# Patient Record
Sex: Male | Born: 1965 | Race: White | Hispanic: No | Marital: Married | Smoking: Never smoker
Health system: Southern US, Community
[De-identification: ages and names within clinical notes are randomized; demographics above are authoritative.]

## PROBLEM LIST (undated history)

## (undated) DIAGNOSIS — M94261 Chondromalacia, right knee: Secondary | ICD-10-CM

## (undated) DIAGNOSIS — R079 Chest pain, unspecified: Secondary | ICD-10-CM

## (undated) DIAGNOSIS — D481 Neoplasm of uncertain behavior of connective and other soft tissue: Secondary | ICD-10-CM

## (undated) DIAGNOSIS — D179 Benign lipomatous neoplasm, unspecified: Secondary | ICD-10-CM

## (undated) HISTORY — PX: VASECTOMY: SHX75

## (undated) HISTORY — PX: KNEE SURGERY: SHX244

## (undated) HISTORY — DX: Neoplasm of uncertain behavior of connective and other soft tissue: D48.1

## (undated) HISTORY — DX: Chondromalacia, right knee: M94.261

## (undated) HISTORY — DX: Chest pain, unspecified: R07.9

## (undated) HISTORY — PX: SHOULDER ARTHROSCOPY: SHX128

## (undated) HISTORY — DX: Benign lipomatous neoplasm, unspecified: D17.9

## (undated) HISTORY — PX: CHOLECYSTECTOMY: SHX55

---

## 2008-06-28 ENCOUNTER — Encounter: Admission: RE | Admit: 2008-06-28 | Discharge: 2008-06-28 | Payer: Self-pay | Admitting: Neurosurgery

## 2011-07-30 ENCOUNTER — Emergency Department (HOSPITAL_COMMUNITY): Payer: BC Managed Care – PPO

## 2011-07-30 ENCOUNTER — Emergency Department (HOSPITAL_COMMUNITY)
Admission: EM | Admit: 2011-07-30 | Discharge: 2011-07-31 | Disposition: A | Discharge status: Home / Self Care | Payer: BC Managed Care – PPO | Attending: Emergency Medicine | Admitting: Emergency Medicine

## 2011-07-30 ENCOUNTER — Encounter (HOSPITAL_COMMUNITY): Payer: Self-pay | Admitting: Emergency Medicine

## 2011-07-30 DIAGNOSIS — Z9852 Vasectomy status: Secondary | ICD-10-CM | POA: Insufficient documentation

## 2011-07-30 DIAGNOSIS — T148XXA Other injury of unspecified body region, initial encounter: Secondary | ICD-10-CM

## 2011-07-30 DIAGNOSIS — I861 Scrotal varices: Secondary | ICD-10-CM | POA: Insufficient documentation

## 2011-07-30 DIAGNOSIS — Z9889 Other specified postprocedural states: Secondary | ICD-10-CM | POA: Insufficient documentation

## 2011-07-30 DIAGNOSIS — R1031 Right lower quadrant pain: Secondary | ICD-10-CM | POA: Insufficient documentation

## 2011-07-30 DIAGNOSIS — R10813 Right lower quadrant abdominal tenderness: Secondary | ICD-10-CM | POA: Insufficient documentation

## 2011-07-30 DIAGNOSIS — X58XXXA Exposure to other specified factors, initial encounter: Secondary | ICD-10-CM | POA: Insufficient documentation

## 2011-07-30 LAB — CBC
HCT: 45.7 % (ref 39.0–52.0)
Hemoglobin: 16.2 g/dL (ref 13.0–17.0)
MCHC: 35.4 g/dL (ref 30.0–36.0)
RDW: 11.9 % (ref 11.5–15.5)
WBC: 6.8 10*3/uL (ref 4.0–10.5)

## 2011-07-30 LAB — URINALYSIS, ROUTINE W REFLEX MICROSCOPIC
Glucose, UA: 100 mg/dL — AB
Ketones, ur: NEGATIVE mg/dL
Leukocytes, UA: NEGATIVE
Nitrite: NEGATIVE
pH: 5.5 (ref 5.0–8.0)

## 2011-07-30 LAB — DIFFERENTIAL
Basophils Absolute: 0 10*3/uL (ref 0.0–0.1)
Basophils Relative: 0 % (ref 0–1)
Lymphocytes Relative: 37 % (ref 12–46)
Monocytes Relative: 5 % (ref 3–12)
Neutro Abs: 3.9 10*3/uL (ref 1.7–7.7)
Neutrophils Relative %: 57 % (ref 43–77)

## 2011-07-30 LAB — BASIC METABOLIC PANEL
CO2: 27 mEq/L (ref 19–32)
Chloride: 104 mEq/L (ref 96–112)
GFR calc Af Amer: >90 mL/min (ref >90)
Potassium: 3.9 mEq/L (ref 3.5–5.1)

## 2011-07-30 MED ORDER — IOHEXOL 300 MG/ML  SOLN
100.0000 mL | Freq: Once | INTRAMUSCULAR | Status: AC | PRN
  Administered 2011-07-30: 100 mL via INTRAVENOUS

## 2011-07-30 NOTE — ED Notes (Signed)
Injured while at work 06/27/2011. Has been managed by Worker's comp at  [employer]. Referred pt to a surgeon in [place] and was recommended to have a CT but was denied by worker's comp

## 2011-07-30 NOTE — ED Notes (Signed)
Pt c/o RLQ abd pain down into right testical. Also has rectal pain.

## 2011-07-30 NOTE — ED Provider Notes (Signed)
History     CSN: [id number]  Arrival date & time 07/30/11  1639   First MD Initiated Contact with Patient 07/30/11 2035      Chief Complaint  Patient presents with  . Abdominal Pain    HPI  History provided by the patient and family. Patient is a 46 year old male with history of cystectomy, facetectomy who presents with complaints of persistent right lower abdominal pains for the past month. Patient states that pain initially came after injuring himself at work lifting heavy boxes. Pain is sharp and persistent and radiates down the groin towards the testicle area. Patient denies having any fever, chills, sweats, decreased appetite, nausea or vomiting. Patient works at [employer] and was sent to [employer]. She has had multiple exams during that time. Patient also developed large hemorrhoids following the injury which he had excised. She does report having sensation of return of a hemorrhoid. Patient was also given a referral to general surgeon who did an exam and told patient he was slightly concerned for possible hernia it was difficult to identify on exam. He has recommended the patient undergo a CAT scan. Patient states that [employer]. not approve for him to have a scan and so he was concerned about returning to work without having a scan performed or knowing what was causing his pains.    No past medical history on file.  Past Surgical History  Procedure Date  . Cholecystectomy   . Vasectomy   . Shoulder arthroscopy     No family history on file.  History  Substance Use Topics  . Smoking status: Not on file  . Smokeless tobacco: Not on file  . Alcohol Use:       Review of Systems  Constitutional: Negative for fever and chills.  Respiratory: Negative for shortness of breath.   Cardiovascular: Negative for chest pain.  Gastrointestinal: Positive for abdominal pain. Negative for nausea, vomiting, diarrhea, constipation and blood in stool.  Genitourinary:  Negative for dysuria, frequency, hematuria and flank pain.    Allergies  Morphine and related  Home Medications   Current Outpatient Rx  Name Route Sig Dispense Refill  . TADALAFIL 10 MG PO TABS Oral Take 10 mg by mouth daily as needed.      BP 126/80  Pulse 62  Temp(Src) 98.3 F (36.8 C) (Oral)  Resp 16  SpO2 99%  Physical Exam  Nursing note and vitals reviewed. Constitutional: He is oriented to person, place, and time. He appears well-developed and well-nourished. No distress.  HENT:  Head: Normocephalic and atraumatic.  Cardiovascular: Normal rate and regular rhythm.   Pulmonary/Chest: Effort normal and breath sounds normal.  Abdominal: Soft. He exhibits no distension. There is tenderness in the right lower quadrant. There is no rebound, no guarding, no CVA tenderness, no tenderness at McBurney's point and negative Murphy's sign.  Genitourinary: Penis normal.       Small varicoceles bilaterally. Testicles normal nontender. No swelling of scrotum or testicles.  No appreciable inguinal hernia identified.  Neurological: He is alert and oriented to person, place, and time.  Skin: Skin is warm.  Psychiatric: He has a normal mood and affect. His behavior is normal.    ED Course  Procedures   Results for orders placed during the hospital encounter of 07/30/11  URINALYSIS, ROUTINE W REFLEX MICROSCOPIC      Component Value Range   Color, Urine YELLOW  YELLOW    APPearance CLOUDY (*) CLEAR    Specific Gravity,  Urine 1.028  1.005 - 1.030    pH 5.5  5.0 - 8.0    Glucose, UA 100 (*) NEGATIVE (mg/dL)   Hgb urine dipstick NEGATIVE  NEGATIVE    Bilirubin Urine NEGATIVE  NEGATIVE    Ketones, ur NEGATIVE  NEGATIVE (mg/dL)   Protein, ur NEGATIVE  NEGATIVE (mg/dL)   Urobilinogen, UA 0.2  0.0 - 1.0 (mg/dL)   Nitrite NEGATIVE  NEGATIVE    Leukocytes, UA NEGATIVE  NEGATIVE   CBC      Component Value Range   WBC 6.8  4.0 - 10.5 (K/uL)   RBC 5.08  4.22 - 5.81 (MIL/uL)   Hemoglobin  16.2  13.0 - 17.0 (g/dL)   HCT 29.5  62.1 - 30.8 (%)   MCV 90.0  78.0 - 100.0 (fL)   MCH 31.9  26.0 - 34.0 (pg)   MCHC 35.4  30.0 - 36.0 (g/dL)   RDW 65.7  84.6 - 96.2 (%)   Platelets 234  150 - 400 (K/uL)  DIFFERENTIAL      Component Value Range   Neutrophils Relative 57  43 - 77 (%)   Neutro Abs 3.9  1.7 - 7.7 (K/uL)   Lymphocytes Relative 37  12 - 46 (%)   Lymphs Abs 2.5  0.7 - 4.0 (K/uL)   Monocytes Relative 5  3 - 12 (%)   Monocytes Absolute 0.3  0.1 - 1.0 (K/uL)   Eosinophils Relative 1  0 - 5 (%)   Eosinophils Absolute 0.1  0.0 - 0.7 (K/uL)   Basophils Relative 0  0 - 1 (%)   Basophils Absolute 0.0  0.0 - 0.1 (K/uL)  BASIC METABOLIC PANEL      Component Value Range   Sodium 139  135 - 145 (mEq/L)   Potassium 3.9  3.5 - 5.1 (mEq/L)   Chloride 104  96 - 112 (mEq/L)   CO2 27  19 - 32 (mEq/L)   Glucose, Bld 93  70 - 99 (mg/dL)   BUN 10  6 - 23 (mg/dL)   Creatinine, Ser 9.52  0.50 - 1.35 (mg/dL)   Calcium 9.5  8.4 - 84.1 (mg/dL)   GFR calc non Af Amer 85 (*) >90 (mL/min)   GFR calc Af Amer >90  >90 (mL/min)      Ct Abdomen Pelvis W Contrast  07/31/2011  *RADIOLOGY REPORT*  Clinical Data: Right lower quadrant pain radiating to the rectum and right testicle.  CT ABDOMEN AND PELVIS WITH CONTRAST  Technique:  Multidetector CT imaging of the abdomen and pelvis was performed following the standard protocol during bolus administration of intravenous contrast.  Contrast: OMNIPAQUE IOHEXOL 300 MG/ML  SOLN  Comparison: None.  Findings: Mild dependent changes in the lung bases.  Small subpleural nodules likely representing lymph nodes.  Surgical absence of the gallbladder.  The liver, spleen, pancreas, adrenal glands, kidneys, abdominal aorta, and retroperitoneal lymph nodes are unremarkable.  Small accessory spleen.  The stomach and small bowel are decompressed.  Colon is decompressed.  No free air or free fluid in the abdomen.  Prominent visceral adipose tissues.  Pelvis:  The  prostate gland is not enlarged.  The bladder is decompressed.  The the colon wall appears to be mildly thickened but is diffusely decompressed.  Apparent wall thickness in the colon may be due to under distension but colitis is not excluded and correlation with clinical and laboratory assessment is recommended.  The appendix is normal.  No free or loculated pelvic fluid  collections.  No significant pelvic lymphadenopathy.  Normal alignment of the lumbar vertebrae with mild degenerative changes.  IMPRESSION: Nonspecific mild appearing colonic thickening which might just be due to under distension but colitis is not excluded.  Appendix is normal.  No renal hydronephrosis or ureterectasis.  Original Report Authenticated By: Marlon Pel, M.D.     1. Abdominal pain   2. Muscle strain       MDM  Patient seen and evaluated. Patient in no acute distress.  Patient with unremarkable CT scan and labwork. There is not appear to be any hernias. Appendix is normal. At this time patient is felt able to return home and followup with employee health and PCP.      Angus Seller, [place] 08/01/11 [id number]

## 2011-07-31 NOTE — Discharge Instructions (Signed)
You were seen and evaluated today for your symptoms of abdominal pain. Your CAT scan did not show any signs for hernia or appendicitis infection. Please continue followup with your primary care providers and employee health specialist for continued evaluation and treatment of your symptoms. Return to emergency room for any worsening pain, fever, chills, sweats, persistent nausea vomiting.   Abdominal Pain Abdominal pain can be caused by many things. Your caregiver decides the seriousness of your pain by an examination and possibly blood tests and X-rays. Many cases can be observed and treated at home. Most abdominal pain is not caused by a disease and will probably improve without treatment. However, in many cases, more time must pass before a clear cause of the pain can be found. Before that point, it may not be known if you need more testing, or if hospitalization or surgery is needed. HOME CARE INSTRUCTIONS   Do not take laxatives unless directed by your caregiver.   Take pain medicine only as directed by your caregiver.   Only take over-the-counter or prescription medicines for pain, discomfort, or fever as directed by your caregiver.   Try a clear liquid diet (broth, tea, or water) for as long as directed by your caregiver. Slowly move to a bland diet as tolerated.  SEEK IMMEDIATE MEDICAL CARE IF:   The pain does not go away.   You have a fever.   You keep throwing up (vomiting).   The pain is felt only in portions of the abdomen. Pain in the right side could possibly be appendicitis. In an adult, pain in the left lower portion of the abdomen could be colitis or diverticulitis.   You pass bloody or black tarry stools.  MAKE SURE YOU:   Understand these instructions.   Will watch your condition.   Will get help right away if you are not doing well or get worse.  Document Released: 11/29/2004 Document Revised: 02/08/2011 Document Reviewed: 10/08/2007 [facility] Patient Information  2012 [place], [place].   RESOURCE GUIDE  Dental Problems  Patients with Medicaid: [facility] [phone] [address]                                           [phone] Phone:  [phone]                                                  Phone:  [phone]  If unable to pay or uninsured, contact:  [facility] or [facility]. to become qualified for the adult dental clinic.  Chronic Pain Problems Contact [facility]  [phone] Patients need to be referred by their primary care doctor.  Insufficient Money for Medicine Contact United Way:  call "211" or [facility] [phone].  No Primary Care Doctor Call [facility]  [phone] Other agencies that provide inexpensive medical care    [facility]  [phone]    [facility]  [phone]    [facility]  [phone]    [facility]  [phone]    Planned Parenthood  [phone]    [facility]  [phone]  Psychological Services [facility]  [phone] [facility]  [phone] [facility]   [phone] (emergency services [phone])  Substance Abuse Resources [facility]  ([phone] [facility] [phone]) [phone] [facility] [phone] Floydene Flock [phone] Residential & Outpatient Substance Abuse Program  ([phone]  Abuse/Neglect [facility] Child Abuse Hotline 873-453-2659 [facility] Child Abuse Hotline 503-439-1625 (After Hours)  Emergency [facility] Minist[facility] 351-141-2313  [facility] (570)266-8820 [facility] (916)590-6257  MRSA Hotline #:   (201)350-0093    [facility] Resources  [facility]     United [employer]                          [facility]. [address] [place]                        [address]      [address]  Blondell Reveal Phone:  [phone]                                   Phone:  [phone]                 Phone:  [phone]  [facility] Phone:  [phone]  [facility] Child Abuse Hotline (810)493-0831 (865)590-9727 (After Hours)

## 2011-08-01 NOTE — ED Provider Notes (Signed)
Medical screening examination/treatment/procedure(s) were performed by non-physician practitioner and as supervising physician I was immediately available for consultation/collaboration.  Juliet Rude. Rubin Payor, MD 08/01/11 [phone]

## 2012-01-03 DIAGNOSIS — D4819 Other specified neoplasm of uncertain behavior of connective and other soft tissue: Secondary | ICD-10-CM

## 2012-01-03 DIAGNOSIS — D481 Neoplasm of uncertain behavior of connective and other soft tissue: Secondary | ICD-10-CM

## 2012-01-03 HISTORY — DX: Other specified neoplasm of uncertain behavior of connective and other soft tissue: D48.19

## 2012-01-03 HISTORY — DX: Neoplasm of uncertain behavior of connective and other soft tissue: D48.1

## 2012-01-15 DIAGNOSIS — D179 Benign lipomatous neoplasm, unspecified: Secondary | ICD-10-CM | POA: Insufficient documentation

## 2012-01-15 DIAGNOSIS — M94261 Chondromalacia, right knee: Secondary | ICD-10-CM

## 2012-01-15 HISTORY — DX: Chondromalacia, right knee: M94.261

## 2012-01-15 HISTORY — DX: Benign lipomatous neoplasm, unspecified: D17.9

## 2015-07-20 ENCOUNTER — Encounter: Payer: Self-pay | Admitting: Internal Medicine

## 2015-08-31 ENCOUNTER — Ambulatory Visit: Payer: Self-pay | Admitting: Internal Medicine

## 2015-09-01 ENCOUNTER — Telehealth: Payer: Self-pay | Admitting: *Deleted

## 2015-09-01 NOTE — Telephone Encounter (Signed)
No show letter mailed to patient. 

## 2016-11-23 ENCOUNTER — Ambulatory Visit (INDEPENDENT_AMBULATORY_CARE_PROVIDER_SITE_OTHER): Payer: BLUE CROSS/BLUE SHIELD | Admitting: Podiatry

## 2016-11-23 ENCOUNTER — Encounter: Payer: Self-pay | Admitting: Podiatry

## 2016-11-23 ENCOUNTER — Ambulatory Visit (INDEPENDENT_AMBULATORY_CARE_PROVIDER_SITE_OTHER): Payer: BLUE CROSS/BLUE SHIELD

## 2016-11-23 ENCOUNTER — Other Ambulatory Visit: Payer: Self-pay | Admitting: Podiatry

## 2016-11-23 VITALS — BP 125/76 | HR 77 | Resp 16

## 2016-11-23 DIAGNOSIS — M722 Plantar fascial fibromatosis: Secondary | ICD-10-CM

## 2016-11-23 NOTE — Patient Instructions (Signed)

## 2016-11-23 NOTE — Progress Notes (Signed)
  Subjective:  Patient ID: Kevin Nielsen, male    DOB: May 24, 1965,  MRN: [id number] HPI Chief Complaint  Patient presents with  . Foot Pain    Plantar heel right - aching for 3 years, gotten worse recently, AM pain, tried ice-helped initially    51 y.o. male presents with the above complaint. Reports right heel pain 3 years duration. Worst in the a.m.. Has tried ice without relief. Denies other pedal issues No past medical history on file. Past Surgical History:  Procedure Laterality Date  . CHOLECYSTECTOMY    . SHOULDER ARTHROSCOPY    . VASECTOMY     No current outpatient prescriptions on file.  Allergies  Allergen Reactions  . Morphine And Related Hives   Review of Systems  Musculoskeletal: Positive for gait problem.  All other systems reviewed and are negative.  Objective:   Vitals:   11/23/16 1118  BP: 125/76  Pulse: 77  Resp: 16   General AA&O x3. Normal mood and affect.  Vascular Dorsalis pedis and posterior tibial pulses  present 2+ bilaterally  Capillary refill normal to all digits. Pedal hair growth normal.  Neurologic Epicritic sensation grossly present bilaterally.  Dermatologic No open lesions. Interspaces clear of maceration. Nails well groomed and normal in appearance.  Orthopedic: MMT 5/5 in dorsiflexion, plantarflexion, inversion, and eversion bilaterally. Tender to palpation at the calcaneal tuber right. No pain with calcaneal squeeze right. Ankle ROM diminished range of motion right. Silfverskiold Test: positive right.   Radiographs: Taken and reviewed. No acute fractures. No evidence of calcaneal stress fracture.  Assessment & Plan:  Patient was evaluated and treated and all questions answered.  Plantar Fasciitis, left - XR reviewed as above.  - Educated on icing and stretching. Instructions given.   - Injection consisting of 1cc 0.5 % Marcaine plain, delivered to the plantar fascia.  Procedure: Injection Tendon/Ligament Location: Left  plantar fascia at the glabrous junction; medial approach. Skin Prep: Alcohol. Injectate: 1 cc 0.5% marcaine plain, 1 cc dexamethasone phosphate, 0.5 cc kenalog 10. Disposition: Patient tolerated procedure well. Injection site dressed with a band-aid.  Return in about 4 weeks (around 12/21/2016).

## 2016-12-04 ENCOUNTER — Telehealth: Payer: Self-pay | Admitting: Podiatry

## 2016-12-04 MED ORDER — MELOXICAM 15 MG PO TABS: 15.0000 mg | ORAL_TABLET | Freq: Every day | ORAL | 0 refills | Status: DC

## 2016-12-04 NOTE — Telephone Encounter (Signed)
I was in and saw Dr. March Rummage about two weeks ago and they were supposed to call in some prescriptions which they never did. Also, I need you to fax my medical records over to Dr. Jackelyn Knife office at [phone]. Thank you.

## 2016-12-04 NOTE — Telephone Encounter (Signed)
Left message informing pt the medication had been sent to the [facility], and apologized for the delay.

## 2016-12-04 NOTE — Addendum Note (Signed)
Addended by: Harriett Sine D on: 12/04/2016 03:32 PM   Modules accepted: Orders

## 2016-12-04 NOTE — Telephone Encounter (Signed)
Can we send over Meloxicam 15mg  #30? Thanks

## 2016-12-04 NOTE — Telephone Encounter (Signed)
Called pt and left a voicemail in regards to his medical records being sent to Dr. Davina Poke. Asked pt to call me back to let me know if he himself is requesting his records be sent to Dr. Davina Poke because if so he would need to sign a release form or if Dr. Davina Poke is his PCP and is requesting his records from his new pt appointment with [place]. Asked pt to call me back directly at [phone] so I can take care of this for him.

## 2016-12-07 ENCOUNTER — Ambulatory Visit: Payer: BLUE CROSS/BLUE SHIELD | Admitting: Podiatry

## 2016-12-21 ENCOUNTER — Ambulatory Visit (INDEPENDENT_AMBULATORY_CARE_PROVIDER_SITE_OTHER): Payer: BLUE CROSS/BLUE SHIELD | Admitting: Podiatry

## 2016-12-21 ENCOUNTER — Ambulatory Visit (INDEPENDENT_AMBULATORY_CARE_PROVIDER_SITE_OTHER): Payer: BLUE CROSS/BLUE SHIELD

## 2016-12-21 ENCOUNTER — Encounter: Payer: Self-pay | Admitting: Podiatry

## 2016-12-21 DIAGNOSIS — M722 Plantar fascial fibromatosis: Secondary | ICD-10-CM

## 2016-12-21 DIAGNOSIS — M7661 Achilles tendinitis, right leg: Secondary | ICD-10-CM

## 2016-12-21 NOTE — Progress Notes (Signed)
  Subjective:  Patient ID: Kevin Nielsen, male    DOB: 1965-10-14,  MRN: [id number]  Chief Complaint  Patient presents with  . Plantar Fasciitis    Follow up right heel   "Its been worse actually"   51 y.o. male returns for the above complaint. He says his heel pain is worse than previous visit. States that he was feeling better when he left after his last visit but 2 days later the pain returned. Would like to discuss surgical solution given that his pain has been present for approximately 3 years on and off.  Objective:  There were no vitals filed for this visit. General AA&O x3. Normal mood and affect.  Vascular Dorsalis pedis and posterior tibial pulses  present 2+ bilaterally  Capillary refill normal to all digits. Pedal hair growth normal.  Neurologic Epicritic sensation grossly present bilaterally.  Dermatologic No open lesions. Interspaces clear of maceration. Nails well groomed and normal in appearance.  Orthopedic: MMT 5/5 in dorsiflexion, plantarflexion, inversion, and eversion bilaterally. Tender to palpation at the calcaneal tuber right. POP posterior calcaneus. No pain with calcaneal squeeze right. Ankle ROM diminished range of motion right. Silfverskiold Test: positive right.   Radiographs: Taken and reviewed. No acute fractures. No evidence of calcaneal stress fracture. Posterior and plantar spurring noted.  Assessment & Plan:  Patient was evaluated and treated and all questions answered.  Plantar Fasciitis, Achilles Tendonitis -Patient has failed all conservative therapy including injections shoe gear modification stretching and wishes to discuss surgical options. -Patient has recalcitrant point fasciitis symptoms present for approximately 3 years on and off. -Risk and benefits of plantar fasciotomy and gastric image position discussed with the patient. Patient wishes to proceed. We'll consider open versus endoscopic approach for both procedures. Patient will discuss  a date with the surgical scheduler -Discussed postoperative course with patient including nonweightbearing for 2 weeks. -Will proceed with right gastroc insertion and right plantar fasciotomy - open versus endoscopic for both  15 minutes of face to face time were spent with the patient. >50% of this was spent on counseling and coordination of care. Specifically discussed with patient the surgical plan, post-operative course. Patient wishes to proceed.

## 2016-12-21 NOTE — Patient Instructions (Signed)
Pre-Operative Instructions  Congratulations, you have decided to take an important step towards improving your quality of life.  You can be assured that the doctors and staff at [facility] will be with you every step of the way.  Here are some important things you should know:  1. Plan to be at the surgery center/hospital at least 1 (one) hour prior to your scheduled time, unless otherwise directed by the surgical center/hospital staff.  You must have a responsible adult accompany you, remain during the surgery and drive you home.  Make sure you have directions to the surgical center/hospital to ensure you arrive on time. 2. If you are having surgery at [facility] or [facility] hospitals, you will need a copy of your medical history and physical form from your family physician within one month prior to the date of surgery. We will give you a form for your primary physician to complete.  3. We make every effort to accommodate the date you request for surgery.  However, there are times where surgery dates or times have to be moved.  We will contact you as soon as possible if a change in schedule is required.   4. No aspirin/ibuprofen for one week before surgery.  If you are on aspirin, any non-steroidal anti-inflammatory medications (Mobic, Aleve, Ibuprofen) should not be taken seven (7) days prior to your surgery.  You make take Tylenol for pain prior to surgery.  5. Medications - If you are taking daily heart and blood pressure medications, seizure, reflux, allergy, asthma, anxiety, pain or diabetes medications, make sure you notify the surgery center/hospital before the day of surgery so they can tell you which medications you should take or avoid the day of surgery. 6. No food or drink after midnight the night before surgery unless directed otherwise by surgical center/hospital staff. 7. No alcoholic beverages 24-hours prior to surgery.  No smoking 24-hours prior or 24-hours after  surgery. 8. Wear loose pants or shorts. They should be loose enough to fit over bandages, boots, and casts. 9. Don't wear slip-on shoes. Sneakers are preferred. 10. Bring your boot with you to the surgery center/hospital.  Also bring crutches or a walker if your physician has prescribed it for you.  If you do not have this equipment, it will be provided for you after surgery. 11. If you have not been contacted by the surgery center/hospital by the day before your surgery, call to confirm the date and time of your surgery. 12. Leave-time from work may vary depending on the type of surgery you have.  Appropriate arrangements should be made prior to surgery with your employer. 13. Prescriptions will be provided immediately following surgery by your doctor.  Fill these as soon as possible after surgery and take the medication as directed. Pain medications will not be refilled on weekends and must be approved by the doctor. 14. Remove nail polish on the operative foot and avoid getting pedicures prior to surgery. 15. Wash the night before surgery.  The night before surgery wash the foot and leg well with water and the antibacterial soap provided. Be sure to pay special attention to beneath the toenails and in between the toes.  Wash for at least three (3) minutes. Rinse thoroughly with water and dry well with a towel.  Perform this wash unless told not to do so by your physician.  Enclosed: 1 Ice pack (please put in freezer the night before surgery)   1 Hibiclens skin cleaner     Pre-op instructions  If you have any questions regarding the instructions, please do not hesitate to call our office.  [place]: [address], [place], [place] [address] -- [phone]  [place]: [address] [place], [place] [address] -- [phone]  [place]: [address]  [place], [place] [address] -- [phone]  [place]: [address], [place], [place] [address] -- [phone]  Website:  [email or web address] 

## 2016-12-27 ENCOUNTER — Telehealth: Payer: Self-pay | Admitting: *Deleted

## 2016-12-27 NOTE — Telephone Encounter (Signed)
I am calling to see if you have a new insurance card.  The cards that we have, [employer] is saying they are inactive.  "I have a new card but it doesn't go into effect until next year."  Can you give me the id for that card?  "It is [id number] and the group number is [id number]."  I'll try this number.

## 2016-12-31 ENCOUNTER — Telehealth: Payer: Self-pay | Admitting: *Deleted

## 2016-12-31 NOTE — Telephone Encounter (Signed)
"  I'm having surgery on Wednesday.  Can you let me know what time and where?"

## 2017-01-01 NOTE — Telephone Encounter (Signed)
Noted. Thanks.

## 2017-01-01 NOTE — Telephone Encounter (Addendum)
"  I am scheduled for surgery on tomorrow.  I received a call from someone at the surgery center that said I would have to pay them $2800 day of surgery.  Can you tell me why that is?"  Your insurance just renewed.  You have a $2500 deductible that has not been met.  Once the deductible is met, you will be responsible for 20%.  "Okay, I don't have that type of money right now.  I'm going to have to reschedule to about a month out."  He can do it on November 28.  "That date will be fine.  They told me I could pay $900 and then make payments but just go ahead and reschedule it to 01/30/2017."  I called Caren Griffins at [facility] and rescheduled the surgery from 01/02/2017 to 01/30/2017.

## 2017-01-09 ENCOUNTER — Encounter: Payer: BLUE CROSS/BLUE SHIELD | Admitting: Podiatry

## 2017-01-16 ENCOUNTER — Encounter: Payer: Self-pay | Admitting: Podiatry

## 2017-01-29 ENCOUNTER — Telehealth: Payer: Self-pay | Admitting: *Deleted

## 2017-01-29 NOTE — Telephone Encounter (Signed)
"  I'm scheduled for surgery tomorrow.  I know this is last minute but I want to reschedule my surgery to late January.  My insurance is changing.  I want to pay off some of this deductible a little before then."  He can do it January 30.  "That will be fine."  I left Leontine Locket, a message to reschedule Rossie's surgery from November 28 to January 30.

## 2017-02-06 ENCOUNTER — Encounter: Payer: BLUE CROSS/BLUE SHIELD | Admitting: Podiatry

## 2017-02-07 ENCOUNTER — Encounter: Payer: BLUE CROSS/BLUE SHIELD | Admitting: Podiatry

## 2017-02-13 ENCOUNTER — Encounter: Payer: Self-pay | Admitting: Podiatry

## 2017-02-14 ENCOUNTER — Encounter: Payer: Self-pay | Admitting: Podiatry

## 2017-03-22 ENCOUNTER — Telehealth: Payer: Self-pay | Admitting: *Deleted

## 2017-03-22 NOTE — Telephone Encounter (Signed)
You're scheduled for surgery on 04/03/2017.  Are you still having the surgery?  "Yes, I am.  Do you have a new insurance policy.  "Yes I do.  I don't have the cards yet.  They will be in the mail next week.  I know the numbers do you want me to give you those?"  Yes, that will be great.  "The ID number is [id number] and the group number is [phone].  I have their phone number do you want it?"  Yes, I do and what's the name of the company is it still [place].  "I am not sure the phone number is [phone].  I think it's [employer]."  Okay, I'll give them a call.

## 2017-03-25 NOTE — Telephone Encounter (Signed)
"  This is [place] giving you a call back from [employer] in regards to [employer].  This was in regards to codes [phone] and [id number], those are both based on standard of care.  No prior authorization is required.  Nothing experimental, investigational or cosmetic would be covered.

## 2017-03-27 DIAGNOSIS — R079 Chest pain, unspecified: Secondary | ICD-10-CM

## 2017-03-27 HISTORY — DX: Chest pain, unspecified: R07.9

## 2017-03-28 ENCOUNTER — Telehealth: Payer: Self-pay | Admitting: *Deleted

## 2017-03-28 NOTE — Telephone Encounter (Signed)
Per Levada Dy in the call center, Kevin Nielsen called and stated he wanted to cancel his surgery for January 30 and wants to put it off until the end of February.  He asked me to call him on tomorrow to reschedule the appointment.  I attempted to call patient.  I was not able to leave a message on either phone's voicemail.     I called [facility] and left a message for Caren Griffins asking her to cancel the patient's surgery for January 30.

## 2017-04-09 ENCOUNTER — Encounter: Payer: Self-pay | Admitting: *Deleted

## 2017-04-10 ENCOUNTER — Encounter: Payer: Self-pay | Admitting: Cardiology

## 2017-04-10 ENCOUNTER — Encounter: Payer: Self-pay | Admitting: Podiatry

## 2017-04-11 ENCOUNTER — Encounter: Payer: Self-pay | Admitting: Cardiology

## 2017-04-11 ENCOUNTER — Ambulatory Visit: Payer: PRIVATE HEALTH INSURANCE | Admitting: Cardiology

## 2017-04-11 DIAGNOSIS — R079 Chest pain, unspecified: Secondary | ICD-10-CM

## 2017-04-11 DIAGNOSIS — R0789 Other chest pain: Secondary | ICD-10-CM | POA: Diagnosis not present

## 2017-04-11 DIAGNOSIS — R0609 Other forms of dyspnea: Secondary | ICD-10-CM | POA: Diagnosis not present

## 2017-04-11 DIAGNOSIS — E785 Hyperlipidemia, unspecified: Secondary | ICD-10-CM

## 2017-04-11 MED ORDER — METOPROLOL TARTRATE 25 MG PO TABS: 25.0000 mg | ORAL_TABLET | Freq: Two times a day (BID) | ORAL | 3 refills | Status: AC

## 2017-04-11 MED ORDER — NITROGLYCERIN 0.4 MG SL SUBL: 0.4000 mg | SUBLINGUAL_TABLET | SUBLINGUAL | 5 refills | Status: AC | PRN

## 2017-04-11 NOTE — Progress Notes (Signed)
Cardiology Consultation:    Date:  04/11/2017   ID:  Kevin Nielsen, DOB 22-May-1965, MRN [id number]  PCP:  Enid Skeens., MD  Cardiologist:  Jenne Campus, MD   Referring MD: Enid Skeens., MD   Chief Complaint  Patient presents with  . Chest Pain  I am having chest pain  History of Present Illness:    Kevin Nielsen is a 52 y.o. male who is being seen today for the evaluation of pain at the request of [place], Marshall Cork., MD.  He has risk factors for coronary artery disease.  He does have family history of premature coronary artery disease: Potentially borderline hypertension.  Also history of dyslipidemia.  For last 2-3 months he started experiencing chest pain.  He described pain as heavy sensation like an elephant sitting on his chest.  It can happen at different situations that can happen with exercise can happen at rest.  That sensation can be continues lasting for hours.  He usually does have some shortness of breath associated with this sensation.  He points into the right side of his chest when asked him to show me where the pain typically happens.  This area is slightly tender on palpation's.  At least now he denies having any pleuritic component but there is some documentation in the chart that it was getting worse while taking deep breath.  At the same time he noticed some shortness of breath.  He is getting short of breath when he is doing some work.  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  Still continue to work however does have some difficulty doing it.  A few days ago he ended up going to the emergency room.  EKG was fine, his biochemical markers were normal as well.  He does not exercise on the regular basis.  Past Medical History:  Diagnosis Date  . Chest pain 03/27/2017  . 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 CHOLECYSTECTOMY    . KNEE SURGERY Right    Cleaned out  . SHOULDER ARTHROSCOPY    . VASECTOMY      Current Medications: No outpatient medications have been marked as taking for the 04/11/17 encounter (Office Visit) with Park Liter, MD.     Allergies:   Morphine and related   Social History   Socioeconomic History  . Marital status: Married    Spouse name: None  . Number of children: None  . Years of education: None  . Highest education level: None  Social Needs  . Financial resource strain: None  . Food insecurity - worry: None  . Food insecurity - inability: None  . Transportation needs - medical: None  . Transportation needs - non-medical: None  Occupational History  . None  Tobacco Use  . Smoking status: Never Smoker  . Smokeless tobacco: Never Used  Substance and Sexual Activity  . Alcohol use: Yes    Comment: very little  . Drug use: None  . Sexual activity: None  Other Topics Concern  . None  Social History Narrative  . None     Family History: The patient's family history includes Arthritis in his mother; Cancer in his maternal grandfather; Diabetes in his maternal grandmother; Heart disease in his father. ROS:   Please see the history of present illness.  All 14 point review of systems negative except as described per history of present illness.  EKGs/Labs/Other Studies Reviewed:    The following studies were reviewed today: All laboratory tests and EKG review from emergency room  EKG:  EKG is  ordered today.  The ekg ordered today demonstrates normal sinus rhythm, normal P interval, normal QRS complex duration morphology  Recent Labs: No results found for requested labs within last 8760 hours.  Recent Lipid Panel No results found for: CHOL, TRIG, HDL, CHOLHDL, VLDL, LDLCALC, LDLDIRECT  Physical Exam:    VS:  BP (!) 130/100 (BP Location: Left Arm, Patient Position: Sitting, Cuff Size: Large)   Pulse 85   Ht 6' (1.829 m)   Wt  292 lb (132.5 kg)   SpO2 96%   BMI 39.60 kg/m     Wt Readings from Last 3 Encounters:  04/11/17 292 lb (132.5 kg)  04/08/17 293 lb (132.9 kg)     GEN:  Well nourished, well developed in no acute distress HEENT: Normal NECK: No JVD; No carotid bruits LYMPHATICS: No lymphadenopathy CARDIAC: RRR, no murmurs, no rubs, no gallops RESPIRATORY:  Clear to auscultation without rales, wheezing or rhonchi  ABDOMEN: Soft, non-tender, non-distended MUSCULOSKELETAL:  No edema; No deformity  SKIN: Warm and dry NEUROLOGIC:  Alert and oriented x 3 PSYCHIATRIC:  Normal affect   ASSESSMENT:    1. Atypical chest pain   2. Dyslipidemia   3. Dyspnea on exertion    PLAN:    In order of problems listed above:  1. Atypical chest pain in this gentleman with risk factors for coronary artery disease.  Pain is not always related to exercise, can be continuous lasting for hours.  Recent visit in the emergency room led to normal biochemical markers.  I advised him to start taking one baby aspirin every single day.  I will give him a Toprol 25 mg twice daily.  He will be given prescription for nitroglycerin I already gave him instructions how to take it with instruction to go to the emergency room if pain will not subside with 3 tablets of nitroglycerin taken 5 minutes apart.  He will be scheduled to have exercise Cardiolite.  We will ask him to do EKG today.  He will be scheduled to have echocardiogram as well to assess his shortness of breath. 2. Dyspnea on exertion: Could be related to coronary artery disease therefore plan of action will be to do echocardiogram to assess ejection fraction as well as stress test to make sure he does not have significant coronary artery disease. 3. Dyslipidemia: His LDL is 101 his HDL is 38.  I will not treat this yet until I have a little bit more understanding about disease process. 4. Erectile  dysfunction: Has been suffering with this for years.  This is something that need  to be investigated in the future.  Warn him that the beta-blocker that I am going to give him can make the situation worse but at least temporarily we need to use that medication   Medication Adjustments/Labs and Tests Ordered: Current medicines are reviewed at length with the patient today.  Concerns regarding medicines are outlined above.  No orders of the defined types were placed in this encounter.  No orders of the defined types were placed in this encounter.   Signed, Park Liter, MD, [facility]. 04/11/2017 9:39 AM    [place]

## 2017-04-11 NOTE — Patient Instructions (Signed)
Medication Instructions:  Your physician has recommended you make the following change in your medication:  START Asprin 81 mg daily, Nitroglycerin if needed for chest pain. If Chest pain continue after 5 minutes take another up to 3 total doses, then call 911 and proceed to your nearest Emergency Room. Start Metoprolol 25 mg 1 tablet 2 times daily.  Labwork: None ordered  Testing/Procedures: EKG Today  Your physician has requested that you have an echocardiogram. Echocardiography is a painless test that uses sound waves to create images of your heart. It provides your doctor with information about the size and shape of your heart and how well your heart's chambers and valves are working. This procedure takes approximately one hour. There are no restrictions for this procedure.  Your physician has requested that you have en exercise stress myoview. For further information please visit [email or web address]. Please follow instruction sheet, as given.  Follow-Up: Your physician recommends that you schedule a follow-up appointment in: 3 weeks with Dr. Agustin Cree   Any Other Special Instructions Will Be Listed Below (If Applicable).     If you need a refill on your cardiac medications before your next appointment, please call your pharmacy.

## 2017-04-17 ENCOUNTER — Encounter: Payer: Self-pay | Admitting: Podiatry

## 2017-04-18 ENCOUNTER — Telehealth (HOSPITAL_COMMUNITY): Payer: Self-pay | Admitting: *Deleted

## 2017-04-18 NOTE — Telephone Encounter (Signed)
Patient given detailed instructions per Myocardial Perfusion Study Information Sheet for the test on 04/22/17. Patient notified to arrive 15 minutes early and that it is imperative to arrive on time for appointment to keep from having the test rescheduled.  If you need to cancel or reschedule your appointment, please call the office within 24 hours of your appointment. . Patient verbalized understanding. Kirstie Peri

## 2017-04-22 ENCOUNTER — Other Ambulatory Visit: Payer: Self-pay

## 2017-04-22 ENCOUNTER — Ambulatory Visit (HOSPITAL_BASED_OUTPATIENT_CLINIC_OR_DEPARTMENT_OTHER): Payer: 59

## 2017-04-22 ENCOUNTER — Ambulatory Visit (HOSPITAL_COMMUNITY): Payer: 59 | Attending: Cardiology

## 2017-04-22 VITALS — Ht 72.0 in | Wt 292.0 lb

## 2017-04-22 DIAGNOSIS — Z6839 Body mass index (BMI) 39.0-39.9, adult: Secondary | ICD-10-CM | POA: Diagnosis not present

## 2017-04-22 DIAGNOSIS — E669 Obesity, unspecified: Secondary | ICD-10-CM | POA: Insufficient documentation

## 2017-04-22 DIAGNOSIS — R079 Chest pain, unspecified: Secondary | ICD-10-CM

## 2017-04-22 DIAGNOSIS — E785 Hyperlipidemia, unspecified: Secondary | ICD-10-CM

## 2017-04-22 DIAGNOSIS — R0789 Other chest pain: Secondary | ICD-10-CM | POA: Insufficient documentation

## 2017-04-22 DIAGNOSIS — Z8249 Family history of ischemic heart disease and other diseases of the circulatory system: Secondary | ICD-10-CM | POA: Diagnosis not present

## 2017-04-22 DIAGNOSIS — R0609 Other forms of dyspnea: Secondary | ICD-10-CM | POA: Diagnosis not present

## 2017-04-22 DIAGNOSIS — I119 Hypertensive heart disease without heart failure: Secondary | ICD-10-CM | POA: Insufficient documentation

## 2017-04-22 MED ORDER — PERFLUTREN LIPID MICROSPHERE
1.0000 mL | INTRAVENOUS | Status: AC | PRN
  Administered 2017-04-22: 2 mL via INTRAVENOUS

## 2017-04-22 MED ORDER — TECHNETIUM TC 99M TETROFOSMIN IV KIT
32.9000 | PACK | Freq: Once | INTRAVENOUS | Status: AC | PRN
  Administered 2017-04-22: 32.9 via INTRAVENOUS
  Filled 2017-04-22: qty 33

## 2017-04-23 ENCOUNTER — Ambulatory Visit (HOSPITAL_COMMUNITY): Payer: PRIVATE HEALTH INSURANCE | Attending: Cardiology

## 2017-04-23 LAB — MYOCARDIAL PERFUSION IMAGING
CHL CUP MPHR: 171 {beats}/min
CHL CUP NUCLEAR SDS: 1
CHL CUP NUCLEAR SSS: 7
CSEPED: 7 min
CSEPHR: 87 %
CSEPPHR: 151 {beats}/min
Estimated workload: 9.3 METS
Exercise duration (sec): 30 s
LHR: 0.36
LV dias vol: 120 mL (ref 62–150)
LV sys vol: 59 mL
Rest HR: 77 {beats}/min
SRS: 6
TID: 0.89

## 2017-04-23 MED ORDER — TECHNETIUM TC 99M TETROFOSMIN IV KIT
32.0000 | PACK | Freq: Once | INTRAVENOUS | Status: AC | PRN
  Administered 2017-04-23: 32 via INTRAVENOUS
  Filled 2017-04-23: qty 32

## 2017-05-29 ENCOUNTER — Ambulatory Visit: Payer: PRIVATE HEALTH INSURANCE | Admitting: Cardiology

## 2017-06-18 ENCOUNTER — Ambulatory Visit: Payer: PRIVATE HEALTH INSURANCE | Admitting: Cardiology

## 2017-06-18 DIAGNOSIS — R0989 Other specified symptoms and signs involving the circulatory and respiratory systems: Secondary | ICD-10-CM

## 2017-10-30 ENCOUNTER — Encounter (HOSPITAL_COMMUNITY): Payer: Self-pay | Admitting: Emergency Medicine

## 2017-10-30 ENCOUNTER — Other Ambulatory Visit: Payer: Self-pay

## 2017-10-30 ENCOUNTER — Emergency Department (HOSPITAL_COMMUNITY)
Admission: EM | Admit: 2017-10-30 | Discharge: 2017-10-31 | Disposition: A | Discharge status: Home / Self Care | Payer: Self-pay | Attending: Emergency Medicine | Admitting: Emergency Medicine

## 2017-10-30 ENCOUNTER — Emergency Department (HOSPITAL_COMMUNITY): Payer: Self-pay

## 2017-10-30 DIAGNOSIS — R1030 Lower abdominal pain, unspecified: Secondary | ICD-10-CM

## 2017-10-30 DIAGNOSIS — R0789 Other chest pain: Secondary | ICD-10-CM | POA: Insufficient documentation

## 2017-10-30 DIAGNOSIS — N433 Hydrocele, unspecified: Secondary | ICD-10-CM | POA: Insufficient documentation

## 2017-10-30 DIAGNOSIS — Z79899 Other long term (current) drug therapy: Secondary | ICD-10-CM | POA: Insufficient documentation

## 2017-10-30 DIAGNOSIS — R110 Nausea: Secondary | ICD-10-CM | POA: Insufficient documentation

## 2017-10-30 DIAGNOSIS — R0602 Shortness of breath: Secondary | ICD-10-CM | POA: Insufficient documentation

## 2017-10-30 DIAGNOSIS — R1031 Right lower quadrant pain: Secondary | ICD-10-CM | POA: Insufficient documentation

## 2017-10-30 DIAGNOSIS — R079 Chest pain, unspecified: Secondary | ICD-10-CM

## 2017-10-30 DIAGNOSIS — I861 Scrotal varices: Secondary | ICD-10-CM | POA: Insufficient documentation

## 2017-10-30 LAB — I-STAT TROPONIN, ED
Troponin i, poc: 0.02 ng/mL (ref 0.00–0.08)
Troponin i, poc: 0.02 ng/mL (ref 0.00–0.08)

## 2017-10-30 LAB — CBC
HCT: 47.2 % (ref 39.0–52.0)
Hemoglobin: 16.4 g/dL (ref 13.0–17.0)
MCH: 32.2 pg (ref 26.0–34.0)
MCHC: 34.7 g/dL (ref 30.0–36.0)
MCV: 92.5 fL (ref 78.0–100.0)
PLATELETS: 233 10*3/uL (ref 150–400)
RBC: 5.1 MIL/uL (ref 4.22–5.81)
RDW: 12.6 % (ref 11.5–15.5)
WBC: 6.9 10*3/uL (ref 4.0–10.5)

## 2017-10-30 LAB — D-DIMER, QUANTITATIVE: D-Dimer, Quant: 1.03 ug/mL-FEU — ABNORMAL HIGH (ref 0.00–0.50)

## 2017-10-30 LAB — URINALYSIS, ROUTINE W REFLEX MICROSCOPIC
Bilirubin Urine: NEGATIVE
GLUCOSE, UA: 50 mg/dL — AB
HGB URINE DIPSTICK: NEGATIVE
Ketones, ur: NEGATIVE mg/dL
LEUKOCYTES UA: NEGATIVE
Nitrite: NEGATIVE
PROTEIN: NEGATIVE mg/dL
Specific Gravity, Urine: 1.026 (ref 1.005–1.030)
pH: 5 (ref 5.0–8.0)

## 2017-10-30 LAB — I-STAT CG4 LACTIC ACID, ED
LACTIC ACID, VENOUS: 0.77 mmol/L (ref 0.5–1.9)
Lactic Acid, Venous: 0.47 mmol/L — ABNORMAL LOW (ref 0.5–1.9)

## 2017-10-30 LAB — COMPREHENSIVE METABOLIC PANEL
ALK PHOS: 76 U/L (ref 38–126)
ALT: 30 U/L (ref 0–44)
AST: 20 U/L (ref 15–41)
Albumin: 3.9 g/dL (ref 3.5–5.0)
Anion gap: 9 (ref 5–15)
BILIRUBIN TOTAL: 1 mg/dL (ref 0.3–1.2)
BUN: 15 mg/dL (ref 6–20)
CALCIUM: 9.5 mg/dL (ref 8.9–10.3)
CHLORIDE: 108 mmol/L (ref 98–111)
CO2: 27 mmol/L (ref 22–32)
CREATININE: 1.28 mg/dL — AB (ref 0.61–1.24)
Glucose, Bld: 93 mg/dL (ref 70–99)
Potassium: 4.6 mmol/L (ref 3.5–5.1)
Sodium: 144 mmol/L (ref 135–145)
Total Protein: 7.1 g/dL (ref 6.5–8.1)

## 2017-10-30 LAB — LIPASE, BLOOD: Lipase: 41 U/L (ref 11–51)

## 2017-10-30 MED ORDER — FENTANYL CITRATE (PF) 100 MCG/2ML IJ SOLN
50.0000 ug | Freq: Once | INTRAMUSCULAR | Status: AC
  Administered 2017-10-30: 50 ug via INTRAVENOUS
  Filled 2017-10-30: qty 2

## 2017-10-30 MED ORDER — IOPAMIDOL (ISOVUE-370) INJECTION 76%
INTRAVENOUS | Status: AC
  Filled 2017-10-30: qty 100

## 2017-10-30 MED ORDER — IOPAMIDOL (ISOVUE-370) INJECTION 76%
100.0000 mL | Freq: Once | INTRAVENOUS | Status: AC | PRN
  Administered 2017-10-30: 100 mL via INTRAVENOUS

## 2017-10-30 MED ORDER — ONDANSETRON HCL 4 MG/2ML IJ SOLN
4.0000 mg | Freq: Once | INTRAMUSCULAR | Status: AC
  Administered 2017-10-30: 4 mg via INTRAVENOUS
  Filled 2017-10-30: qty 2

## 2017-10-30 MED ORDER — SODIUM CHLORIDE 0.9 % IV BOLUS
1000.0000 mL | Freq: Once | INTRAVENOUS | Status: AC
  Administered 2017-10-30: 1000 mL via INTRAVENOUS

## 2017-10-30 NOTE — Discharge Instructions (Signed)
Your work-up today showed evidence of both hydrocele and varicocele in your groin.  I suspect this is the cause of your pain.  We did not see evidence of blood clot in your lungs, evidence of heart injury, or any evidence of intra-abdominal pathology causing her symptoms.  Please follow-up with your primary doctor and your urologist.  If any symptoms change or worsen, please return to the nearest emergency department.  Please stay hydrated.

## 2017-10-30 NOTE — ED Triage Notes (Signed)
Pt reports has lump on penis and right testicle since Friday. Reports that he had testicle pain since vasectomy 9 years ago. Today at work started vomiting. Denies bowel problems and reports that urine stream is weak.

## 2017-10-30 NOTE — ED Notes (Addendum)
Pt reports that he is having heaviness in his central  chest 5/10 pain. Provider made aware. EKG repeated. I stat trop drawn

## 2017-10-30 NOTE — ED Provider Notes (Signed)
[facility] DEPT Provider Note   CSN: [id number] Arrival date & time: 10/30/17  1505     History   Chief Complaint Chief Complaint  Patient presents with  . lump on penis  . Emesis    HPI Kevin Nielsen is a 52 y.o. male.  The history is provided by the patient and medical records. No language interpreter was used.  Groin Pain  This is a recurrent problem. The current episode started more than 2 days ago. The problem occurs constantly. The problem has been gradually worsening. Associated symptoms include chest pain, abdominal pain and shortness of breath. Pertinent negatives include no headaches. Nothing aggravates the symptoms. Nothing relieves the symptoms. He has tried nothing for the symptoms. The treatment provided no relief.    Past Medical History:  Diagnosis Date  . Chest pain 03/27/2017  . 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 Atypical chest pain 04/11/2017  . Dyslipidemia 04/11/2017  . Dyspnea on exertion 04/11/2017  . 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 CHOLECYSTECTOMY    . KNEE SURGERY Right    Cleaned out  . SHOULDER ARTHROSCOPY    . VASECTOMY          Home Medications    Prior to Admission medications   Medication Sig Start Date End Date Taking? Authorizing Provider  metoprolol tartrate (LOPRESSOR) 25 MG tablet Take 1 tablet (25 mg total) by mouth 2 (two) times daily. 04/11/17 07/10/17  Park Liter, MD  nitroGLYCERIN (NITROSTAT) 0.4 MG SL tablet Place 1 tablet (0.4 mg total) under the tongue every 5 (five) minutes as needed for chest pain. 04/11/17 07/10/17  Park Liter,  MD    Family History Family History  Problem Relation Age of Onset  . Arthritis Mother   . Heart disease Father   . Diabetes Maternal Grandmother   . Cancer Maternal Grandfather     Social History Social History   Tobacco Use  . Smoking status: Never Smoker  . Smokeless tobacco: Never Used  Substance Use Topics  . Alcohol use: Yes    Comment: very little  . Drug use: Not on file     Allergies   Morphine and related   Review of Systems Review of Systems  Constitutional: Negative for chills, diaphoresis, fatigue and fever.  HENT: Negative for congestion.   Respiratory: Positive for cough, chest tightness and shortness of breath. Negative for wheezing and stridor.   Cardiovascular: Positive for chest pain. Negative for palpitations and leg swelling.  Gastrointestinal: Positive for abdominal pain and nausea. Negative for constipation, diarrhea and vomiting.  Genitourinary: Positive for testicular pain. Negative for decreased urine volume, discharge, flank pain, frequency, penile pain and scrotal swelling.  Musculoskeletal: Negative for back pain, myalgias and neck stiffness.  Neurological: Negative for light-headedness and headaches.  Psychiatric/Behavioral: Negative for agitation.  All other systems reviewed and are negative.    Physical Exam Updated Vital Signs BP (!) 131/92 (BP Location: Left Arm)   Pulse 81   Temp 98.1 F (36.7 C) (Oral)   Resp 20   Ht 6' (1.829 m)   Wt 134.9 kg   SpO2 98%  BMI 40.35 kg/m   Physical Exam  Constitutional: He is oriented to person, place, and time. He appears well-developed and well-nourished. No distress.  HENT:  Head: Normocephalic and atraumatic.  Nose: Nose normal.  Mouth/Throat: Oropharynx is clear and moist. No oropharyngeal exudate.  Eyes: Pupils are equal, round, and reactive to light. Conjunctivae are normal.  Neck: Normal range of motion. Neck supple.  Cardiovascular: Normal rate and regular rhythm.  No  murmur heard. Pulmonary/Chest: Effort normal and breath sounds normal. No respiratory distress. He has no wheezes. He has no rales. He exhibits tenderness.    Abdominal: Soft. Normal appearance. There is tenderness in the right lower quadrant. There is no rigidity, no rebound, no guarding and no CVA tenderness. Hernia confirmed negative in the right inguinal area and confirmed negative in the left inguinal area.    Genitourinary: Cremasteric reflex is present. Right testis shows tenderness. Right testis is descended. Cremasteric reflex is not absent on the right side. Left testis shows tenderness. Left testis is descended. Cremasteric reflex is not absent on the left side. No penile tenderness.     Musculoskeletal: He exhibits no edema or tenderness.  Lymphadenopathy: No inguinal adenopathy noted on the right or left side.  Neurological: He is alert and oriented to person, place, and time. No cranial nerve deficit or sensory deficit. He exhibits normal muscle tone. Coordination normal.  Skin: Skin is warm and dry. Capillary refill takes less than 2 seconds. He is not diaphoretic. No erythema. No pallor.  Psychiatric: He has a normal mood and affect.  Nursing note and vitals reviewed.    ED Treatments / Results  Labs (all labs ordered are listed, but only abnormal results are displayed) Labs Reviewed  COMPREHENSIVE METABOLIC PANEL - Abnormal; Notable for the following components:      Result Value   Creatinine, Ser 1.28 (*)    All other components within normal limits  URINALYSIS, ROUTINE W REFLEX MICROSCOPIC - Abnormal; Notable for the following components:   Glucose, UA 50 (*)    All other components within normal limits  D-DIMER, QUANTITATIVE (NOT AT [facility]) - Abnormal; Notable for the following components:   D-Dimer, Quant 1.03 (*)    All other components within normal limits  I-STAT CG4 LACTIC ACID, ED - Abnormal; Notable for the following components:   Lactic Acid, Venous 0.47  (*)    All other components within normal limits  LIPASE, BLOOD  CBC  I-STAT CG4 LACTIC ACID, ED  I-STAT TROPONIN, ED  I-STAT TROPONIN, ED    EKG EKG Interpretation  Date/Time:  Wednesday October 30 2017 18:09:12 EDT Ventricular Rate:  67 PR Interval:    QRS Duration: 101 QT Interval:  394 QTC Calculation: 416 R Axis:   11 Text Interpretation:  Sinus rhythm Low voltage, precordial leads Consider anterior infarct NO prior ECG for comaprison.  ?[id number] No STEMI Confirmed by Antony Blackbird [phone]) on 10/30/2017 6:20:20 PM   Radiology Dg Chest 2 View  Result Date: 10/30/2017 CLINICAL DATA:  Acute onset of cough, shortness of breath and chills. Possible palpable lumps involving the penis and RIGHT testicle initially noted 5 days ago. EXAM: CHEST - 2 VIEW COMPARISON:  03/27/2017, 03/09/2011 and earlier. FINDINGS: AP semi-ERECT and LATERAL images were obtained. Cardiac silhouette upper normal in size for AP technique. Hilar and mediastinal contours otherwise unremarkable. Suboptimal inspiration which accounts for atelectasis in the RIGHT LOWER LOBE. Lungs otherwise clear. Mild pulmonary venous hypertension without overt edema. Bronchovascular markings normal. No pleural effusions.  Visualized bony thorax intact. IMPRESSION: Suboptimal inspiration which accounts for RIGHT LOWER LOBE atelectasis. No acute cardiopulmonary disease otherwise. Electronically Signed   By: Evangeline Dakin M.D.   On: 10/30/2017 19:09   Ct Angio Chest Pe W And/or Wo Contrast  Result Date: 10/30/2017 CLINICAL DATA:  Chest tightness with shortness of breath, elevated D-dimer, suspected pulmonary embolism of intermediate clinical probability, lump on penis and RIGHT testis, nausea, vomiting, abdominal pain, RIGHT lower quadrant pain and tenderness question appendicitis, history kidney stones EXAM: CT ANGIOGRAPHY CHEST CT ABDOMEN AND PELVIS WITH CONTRAST TECHNIQUE: Multidetector CT imaging of the chest was performed using the  standard protocol during bolus administration of intravenous contrast. Multiplanar CT image reconstructions and MIPs were obtained to evaluate the vascular anatomy. Multidetector CT imaging of the abdomen and pelvis was performed using the standard protocol during bolus administration of intravenous contrast. CONTRAST:  133mL ISOVUE-370 IOPAMIDOL (ISOVUE-370) INJECTION 76% IV. No oral contrast administered. COMPARISON:  CT chest 07/27/2008, CT abdomen and pelvis 07/29/2015 FINDINGS: CTA CHEST FINDINGS Cardiovascular: Aorta normal caliber without aneurysm or dissection. Pulmonary arteries adequately opacified. Scattered respiratory motion artifacts. Streak artifacts from dense contrast in SVC. No definite pulmonary emboli identified. Heart unremarkable. No pericardial effusion. Mediastinum/Nodes: Base of cervical region normal appearance. Esophagus unremarkable. No thoracic adenopathy. Lungs/Pleura: Atelectasis in the posterior lungs. Noncalcified 11 x 7 mm RIGHT lower lobe nodule posteriorly image 59, measured 9 x 5 mm on 07/27/2008. Dependent atelectasis in the posterior lungs. Scattered infiltrate LEFT lower lobe. Additional 5 mm nodule RIGHT upper lobe image 56 adjacent to minor fissure unchanged. Remaining lungs clear. No pleural effusion or pneumothorax. Musculoskeletal: Unremarkable Review of the MIP images confirms the above findings. CT ABDOMEN and PELVIS FINDINGS Hepatobiliary: Gallbladder surgically absent.  Liver unremarkable. Pancreas: Normal appearance Spleen: Normal appearance. Small splenule adjacent to pancreatic tail. Adrenals/Urinary Tract: Adrenal glands, kidneys, ureters, and bladder normal appearance Stomach/Bowel: Normal appendix. Few scattered uncomplicated colonic diverticula. Stomach and bowel loops otherwise normal appearance. Vascular/Lymphatic: Vascular structures unremarkable. No adenopathy. Reproductive: Normal appearance Other: Tiny umbilical hernia containing fat. No free air free  fluid. Musculoskeletal: Osseous structures unremarkable. Review of the MIP images confirms the above findings. IMPRESSION: No evidence of pulmonary embolism. Tiny umbilical hernia containing fat. No acute intra-abdominal or intrapelvic abnormalities. Minimal increase in size of a noncalcified RIGHT lower lobe pulmonary nodule since 2010, currently 11 x 7 mm and previously 9 x 5 mm; the minimal growth over 9 years favors a benign process. Electronically Signed   By: Lavonia Dana M.D.   On: 10/30/2017 20:33   Ct Abdomen Pelvis W Contrast  Result Date: 10/30/2017 CLINICAL DATA:  Chest tightness with shortness of breath, elevated D-dimer, suspected pulmonary embolism of intermediate clinical probability, lump on penis and RIGHT testis, nausea, vomiting, abdominal pain, RIGHT lower quadrant pain and tenderness question appendicitis, history kidney stones EXAM: CT ANGIOGRAPHY CHEST CT ABDOMEN AND PELVIS WITH CONTRAST TECHNIQUE: Multidetector CT imaging of the chest was performed using the standard protocol during bolus administration of intravenous contrast. Multiplanar CT image reconstructions and MIPs were obtained to evaluate the vascular anatomy. Multidetector CT imaging of the abdomen and pelvis was performed using the standard protocol during bolus administration of intravenous contrast. CONTRAST:  119mL ISOVUE-370 IOPAMIDOL (ISOVUE-370) INJECTION 76% IV. No oral contrast administered. COMPARISON:  CT chest 07/27/2008, CT abdomen and pelvis 07/29/2015 FINDINGS: CTA CHEST FINDINGS Cardiovascular: Aorta normal caliber without aneurysm or dissection. Pulmonary arteries adequately opacified. Scattered respiratory motion artifacts. Streak artifacts from dense contrast in  SVC. No definite pulmonary emboli identified. Heart unremarkable. No pericardial effusion. Mediastinum/Nodes: Base of cervical region normal appearance. Esophagus unremarkable. No thoracic adenopathy. Lungs/Pleura: Atelectasis in the posterior lungs.  Noncalcified 11 x 7 mm RIGHT lower lobe nodule posteriorly image 59, measured 9 x 5 mm on 07/27/2008. Dependent atelectasis in the posterior lungs. Scattered infiltrate LEFT lower lobe. Additional 5 mm nodule RIGHT upper lobe image 56 adjacent to minor fissure unchanged. Remaining lungs clear. No pleural effusion or pneumothorax. Musculoskeletal: Unremarkable Review of the MIP images confirms the above findings. CT ABDOMEN and PELVIS FINDINGS Hepatobiliary: Gallbladder surgically absent.  Liver unremarkable. Pancreas: Normal appearance Spleen: Normal appearance. Small splenule adjacent to pancreatic tail. Adrenals/Urinary Tract: Adrenal glands, kidneys, ureters, and bladder normal appearance Stomach/Bowel: Normal appendix. Few scattered uncomplicated colonic diverticula. Stomach and bowel loops otherwise normal appearance. Vascular/Lymphatic: Vascular structures unremarkable. No adenopathy. Reproductive: Normal appearance Other: Tiny umbilical hernia containing fat. No free air free fluid. Musculoskeletal: Osseous structures unremarkable. Review of the MIP images confirms the above findings. IMPRESSION: No evidence of pulmonary embolism. Tiny umbilical hernia containing fat. No acute intra-abdominal or intrapelvic abnormalities. Minimal increase in size of a noncalcified RIGHT lower lobe pulmonary nodule since 2010, currently 11 x 7 mm and previously 9 x 5 mm; the minimal growth over 9 years favors a benign process. Electronically Signed   By: Lavonia Dana M.D.   On: 10/30/2017 20:33   US Scrotum W/doppler  Result Date: 10/30/2017 CLINICAL DATA:  Right testicular pain x5 days. EXAM: SCROTAL ULTRASOUND DOPPLER ULTRASOUND OF THE TESTICLES TECHNIQUE: Complete ultrasound examination of the testicles, epididymis, and other scrotal structures was performed. Color and spectral Doppler ultrasound were also utilized to evaluate blood flow to the testicles. COMPARISON:  None. FINDINGS: Right testicle Measurements: 4.8 x  2.4 x 3.2 cm. No mass or microlithiasis visualized. Left testicle Measurements: 4.7 x 2.4 x 2.9 cm. No mass or microlithiasis visualized. Right epididymis:  4 mm epididymal head cyst. Left epididymis: 14 x 9 mm cyst in the epididymal tail. Hydrocele:  Small bilateral hydroceles. Varicocele:  Left-sided varicoceles are present. Pulsed Doppler interrogation of both testes demonstrates normal low resistance arterial and venous waveforms bilaterally. A scrotolith is seen adjacent to the lower pole of the testicle measuring 6 x 2 x 4 mm. IMPRESSION: 1. No intratesticular mass or torsion. 2. Small bilateral epididymal cysts, small bilateral hydroceles and left-sided varicoceles. 3. A 6 x 2 x 4 mm calcified scrotolith is seen adjacent to the lower pole of left testicle. Electronically Signed   By: Ashley Royalty M.D.   On: 10/30/2017 20:45    Procedures Procedures (including critical care time)  Medications Ordered in ED Medications  iopamidol (ISOVUE-370) 76 % injection (has no administration in time range)  fentaNYL (SUBLIMAZE) injection 50 mcg (50 mcg Intravenous Given 10/30/17 1842)  ondansetron (ZOFRAN) injection 4 mg (4 mg Intravenous Given 10/30/17 1842)  sodium chloride 0.9 % bolus 1,000 mL (0 mLs Intravenous Stopped 10/30/17 2025)  iopamidol (ISOVUE-370) 76 % injection 100 mL (100 mLs Intravenous Contrast Given 10/30/17 2001)     Initial Impression / Assessment and Plan / ED Course  I have reviewed the triage vital signs and the nursing notes.  Pertinent labs & imaging results that were available during my care of the patient were reviewed by me and considered in my medical decision making (see chart for details).     Kevin Nielsen is a 52 y.o. male with a past medical history significant for lipomas and  dyslipidemia who presents with chest pain, shortness of breath, chills, cough, right lower abdominal pain, and right testicle/groin pain.  Patient reports that he has had left-sided groin pain on  and off since a vasectomy years ago.  He reports that over the last few days he has had development of pain in his right lower quadrant and right groin going into his right testicle.  He reports no change in urination but he does report that a knot was noticed on the proximal portion of his penis that feels like a BB or a small pea-sized ball bearing.  He reports it is tender to the touch.  He denies any hematuria.  He does report that he has had a history of a kidney stone before that had to have surgery to remove.  He denies nausea, vomiting, or trauma.  He reports that he has had some shortness of breath with his cough and chest discomfort.  He reports mildly decreased urination.  He denies recent diarrhea but reports it has improved.  He denies other complaints on arrival.  On exam, patient had right lower quadrant abdominal tenderness.  No hernia was palpated on my exam.  Right testicle was tender to palpation and a small bump was felt at the proximal area of the penis.  Patient had cremasteric reflexes bilaterally.  Lungs were clear and chest was mildly tender to palpation.  Patient otherwise appears well.  EKG showed possible S1/Q3/T3.  Given the patient's pleuritic chest pain and his EKG finding, he will have a d-dimer added.  Patient will have imaging including a chest x-ray and abdominal imaging as well as ultrasound of the scrotum and testicles.  Patient given pain medicine and nausea medicine and fluids initially.  Next  Anticipate reassessment after work-up.      7:51 PM D-dimer was positive, will obtain PE study as well as the other imaging to further work-up his complaints.  Patient's work-up was overall reassuring.  Ultrasound showed varicocele and hydroceles.  Patient also had a scrotal lift.  This is likely the discomforting bump he felt.  CTs of the chest abdomen and pelvis did not show blood clots or appendicitis.  Slight growth of a pulmonary nodule was seen.  Patient was informed  of findings.  No evidence of appendicitis or diverticulitis.  Patient reported feeling better after work-up.  Suspect musculoskeletal discomfort as well as pain related to the hydrocele/varicocele's.  Patient given instructions to follow-up with his PCP and urology.  Patient understood return precautions.  Patient discharged in good condition with improving symptoms.   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 Inguinal pain, unspecified laterality   2. Hydrocele in adult   3. Varicocele present on ultrasound of scrotum   4. Right lower quadrant abdominal pain   5. Chest pain, unspecified type     Disposition: Discharge  Condition: Good  I have discussed the results, Dx and Tx plan with the pt(& family if present). He/she/they expressed understanding and agree(s) with the plan. Discharge instructions discussed at great length. Strict return precautions discussed and pt &/or family have verbalized understanding of the instructions. No further questions at time of discharge.    Discharge Medication List as of 10/30/2017 11:56  PM      Follow Up: Enid Skeens., MD [address] [place] [address] [phone]     [facility] [place] [address] [place] [address] [place]       Pattie Flaharty, Gwenyth Allegra, MD 10/31/17 [phone]

## 2017-10-30 NOTE — ED Notes (Signed)
Pt is aware of elevated BP and states that he will take BP medicine when he gets home. RN made aware

## 2018-10-04 ENCOUNTER — Other Ambulatory Visit: Payer: Self-pay | Admitting: Cardiology

## 2019-01-15 ENCOUNTER — Other Ambulatory Visit: Payer: Self-pay | Admitting: Orthopedic Surgery

## 2019-01-15 DIAGNOSIS — M533 Sacrococcygeal disorders, not elsewhere classified: Secondary | ICD-10-CM

## 2019-01-26 ENCOUNTER — Ambulatory Visit
Admission: RE | Admit: 2019-01-26 | Discharge: 2019-01-26 | Disposition: A | Discharge status: Home / Self Care | Payer: Self-pay | Source: Ambulatory Visit | Attending: Orthopedic Surgery | Admitting: Orthopedic Surgery

## 2019-01-26 ENCOUNTER — Ambulatory Visit
Admission: RE | Admit: 2019-01-26 | Discharge: 2019-01-26 | Disposition: A | Discharge status: Home / Self Care | Payer: No Typology Code available for payment source | Source: Ambulatory Visit | Attending: Orthopedic Surgery | Admitting: Orthopedic Surgery

## 2019-01-26 ENCOUNTER — Other Ambulatory Visit: Payer: Self-pay

## 2019-01-26 DIAGNOSIS — M533 Sacrococcygeal disorders, not elsewhere classified: Secondary | ICD-10-CM

## 2019-02-05 ENCOUNTER — Other Ambulatory Visit: Payer: Self-pay | Admitting: Orthopedic Surgery

## 2019-02-05 DIAGNOSIS — M533 Sacrococcygeal disorders, not elsewhere classified: Secondary | ICD-10-CM

## 2019-02-13 ENCOUNTER — Other Ambulatory Visit: Payer: Self-pay

## 2019-02-13 ENCOUNTER — Ambulatory Visit
Admission: RE | Admit: 2019-02-13 | Discharge: 2019-02-13 | Disposition: A | Discharge status: Home / Self Care | Payer: No Typology Code available for payment source | Source: Ambulatory Visit | Attending: Orthopedic Surgery | Admitting: Orthopedic Surgery

## 2019-02-13 DIAGNOSIS — M533 Sacrococcygeal disorders, not elsewhere classified: Secondary | ICD-10-CM

## 2019-02-13 MED ORDER — METHYLPREDNISOLONE ACETATE 40 MG/ML INJ SUSP (RADIOLOG
120.0000 mg | Freq: Once | INTRAMUSCULAR | Status: AC
  Administered 2019-02-13: 120 mg via INTRA_ARTICULAR

## 2019-02-13 NOTE — Discharge Instructions (Signed)

## 2021-03-03 IMAGING — CT CT BIOPSY
2 of 6 series · 12 of 32 positions shown, 18 images · non-contrast
Comparison: none

CLINICAL DATA: Right sacroiliac pain.

[Series 2: needle -guided injection · axial · 0.88mm/px · z∈[-129,-53]mm · 10 of 48 slices shown, 16 images (1 of 2)]
[im 5/48  soft-tissue]
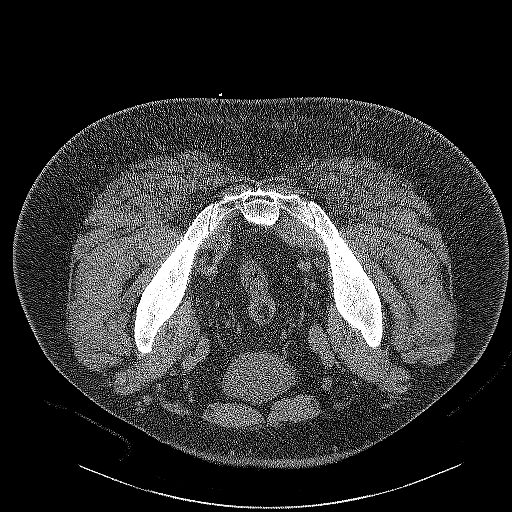
[im 5/48  bone]
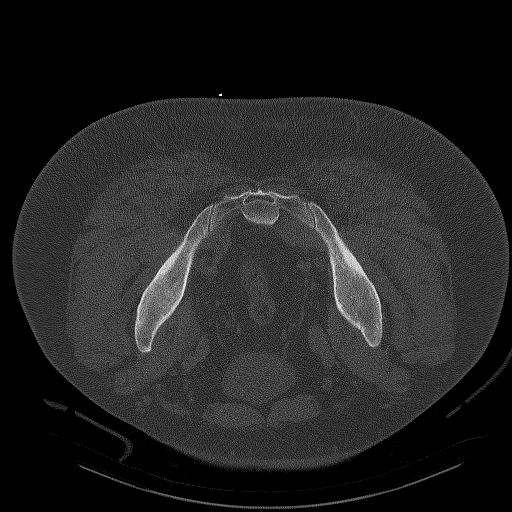
[im 9/48  soft-tissue]
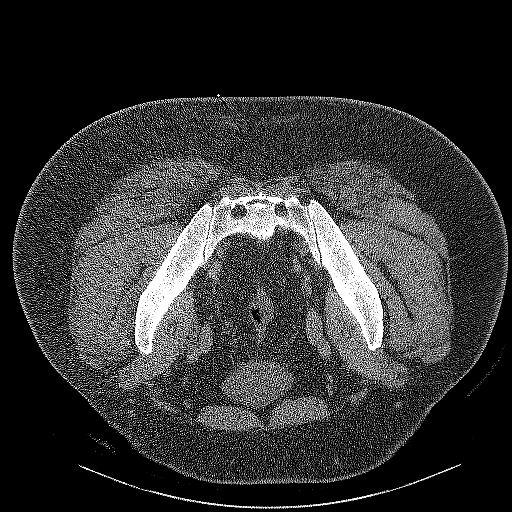
[im 13/48  soft-tissue]
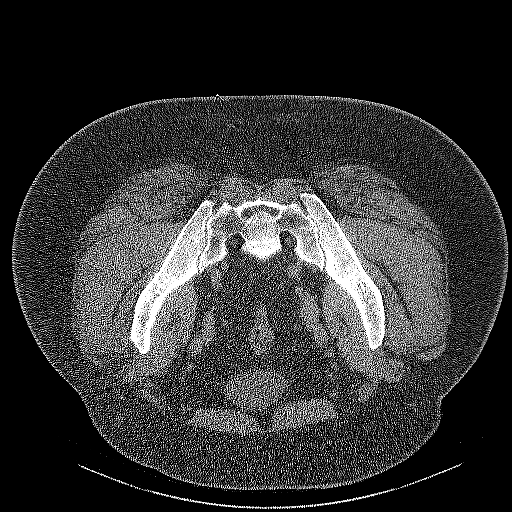
[im 18/48  soft-tissue]
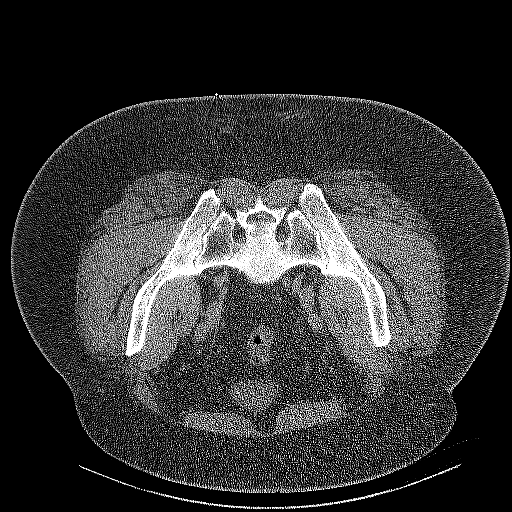
[im 22/48  soft-tissue]
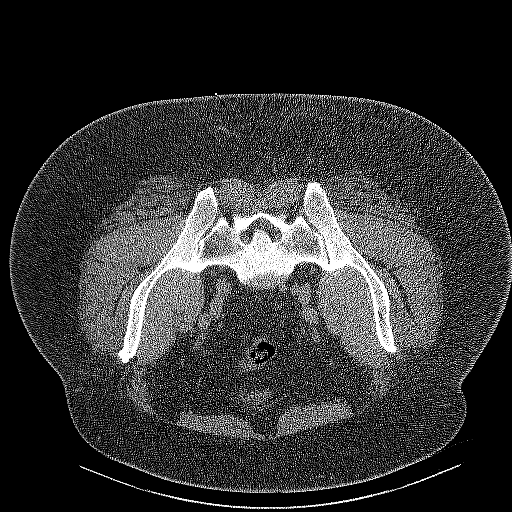
[im 26/48  soft-tissue]
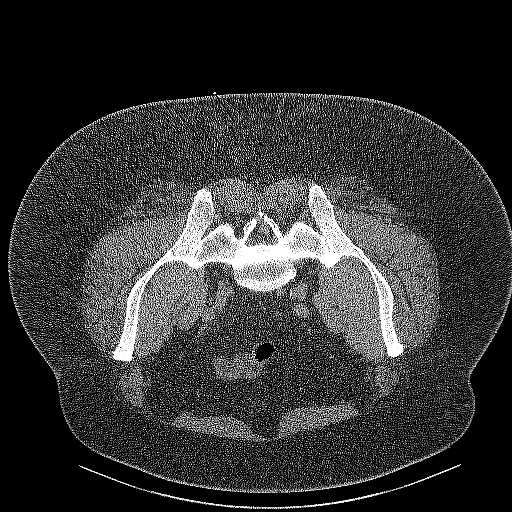
[im 30/48  soft-tissue]
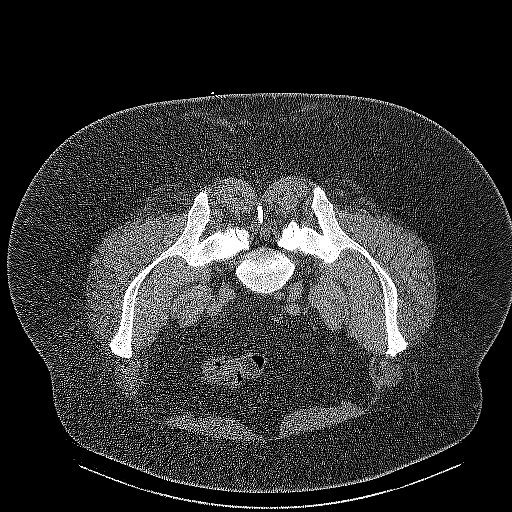
[im 30/48  lung]
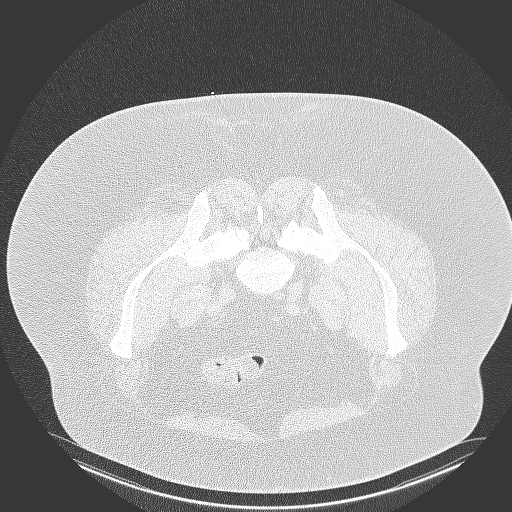
[im 35/48  soft-tissue]
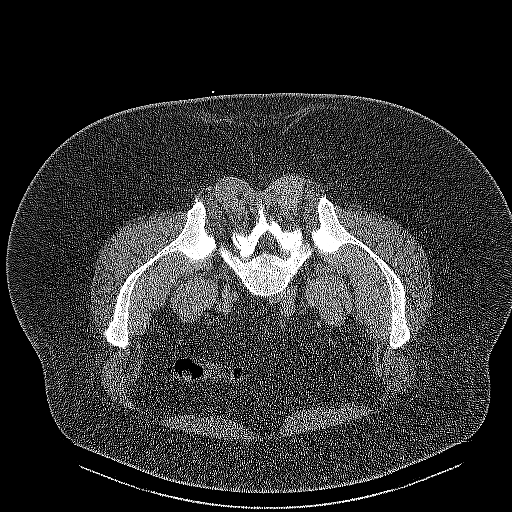
[im 35/48  lung]
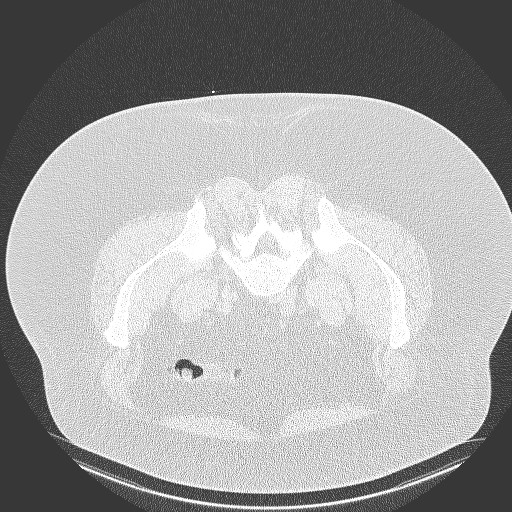
[im 39/48  soft-tissue]
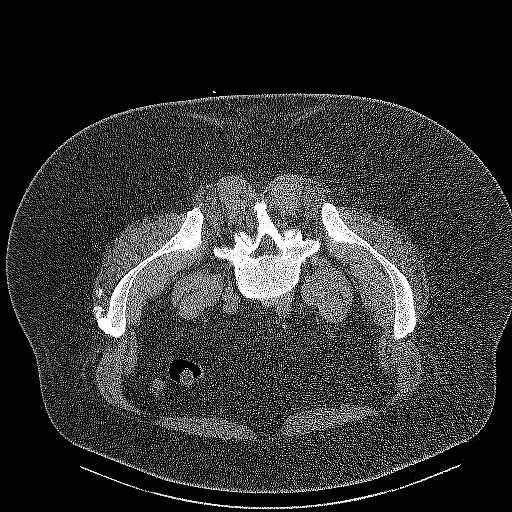
[im 39/48  lung]
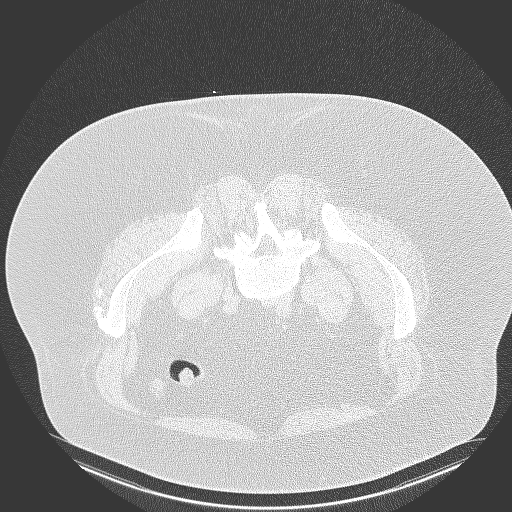
[im 39/48  bone]
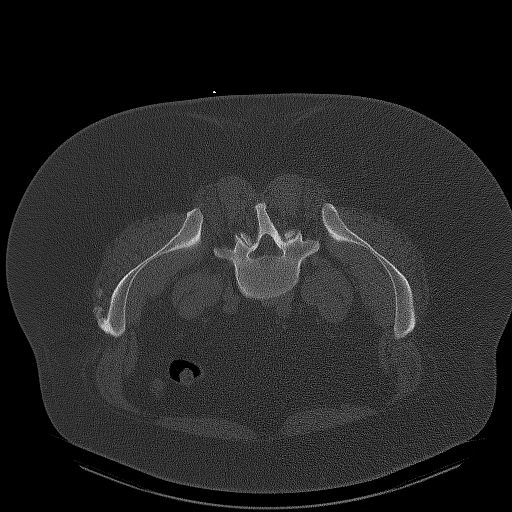
[im 43/48  soft-tissue]
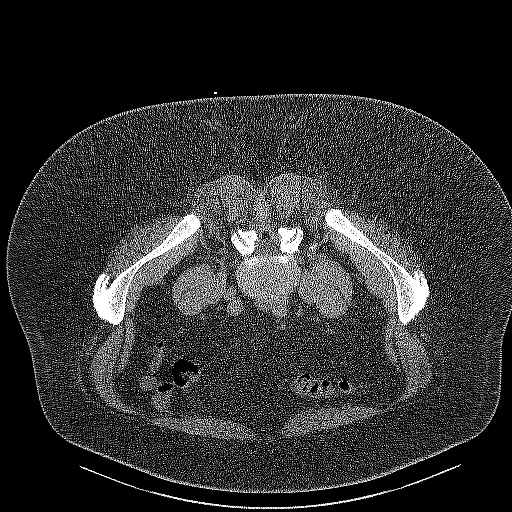
[im 43/48  lung]
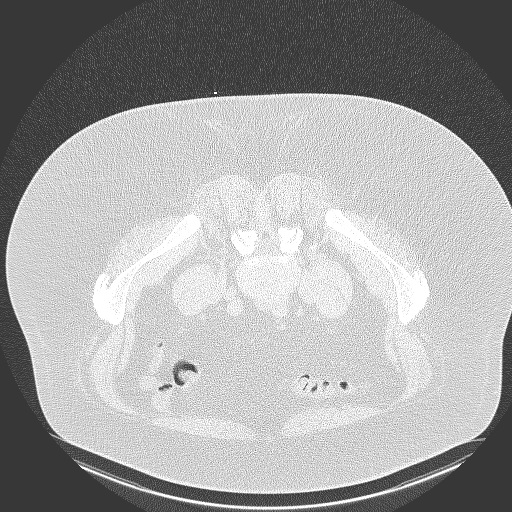

[Series 3: needle -guided injection · axial · 0.88mm/px · z∈[-130,-118]mm · 2 of 19 slices shown (2 of 2)]
[im 7/19  soft-tissue]
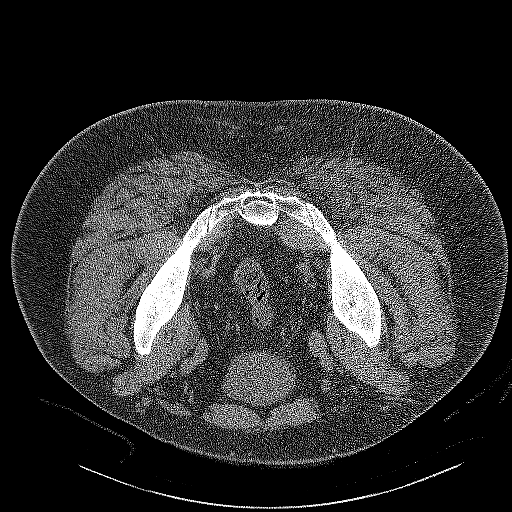
[im 13/19  soft-tissue]
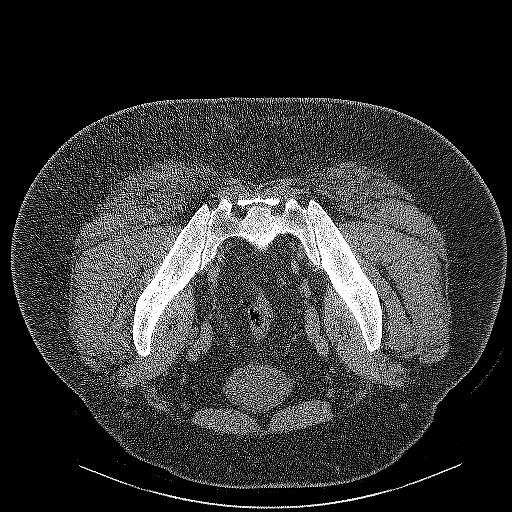

[12 of 32 positions shown; findings below may reference images not displayed]

EXAM:
CT GUIDED RIGHT SI JOINT INJECTION

PROCEDURE:
After a thorough discussion of risks and benefits of the procedure,
including bleeding, infection, injury to nerves, blood vessels, and
adjacent structures, verbal and written consent was obtained. The
patient was placed prone on the CT table and localization was
performed over the sacrum. Target site marked using CT guidance. The
skin was prepped and draped in the usual sterile fashion using
Betadine soap.

After local anesthesia with 1% lidocaine without epinephrine and
subsequent deep anesthesia, a 5 inch 22 gauge spinal needle was
advanced into the right SI joint under intermittent CT guidance.

Injection of a small amount of Isovue-M 200 confirmed
intra-articular needle placement. Subsequently, 2 mL of 0.5%
bupivacaine were injected into the right SI joint. The needle was
removed and a sterile dressing applied.

No complications were observed.
IMPRESSION: Successful CT-guided right SI joint injection.
